# Patient Record
Sex: Male | Born: 1995 | Race: Black or African American | Hispanic: No | Marital: Single | Smoking: Never smoker
Health system: Southern US, Community
[De-identification: ages and names within clinical notes are randomized; demographics above are authoritative.]

## PROBLEM LIST (undated history)

## (undated) DIAGNOSIS — J45909 Unspecified asthma, uncomplicated: Secondary | ICD-10-CM

---

## 2018-07-07 ENCOUNTER — Emergency Department (HOSPITAL_COMMUNITY): Payer: Self-pay

## 2018-07-07 ENCOUNTER — Emergency Department (HOSPITAL_COMMUNITY)
Admission: EM | Admit: 2018-07-07 | Discharge: 2018-07-08 | Disposition: A | Discharge status: Home / Self Care | Payer: Self-pay | Attending: Emergency Medicine | Admitting: Emergency Medicine

## 2018-07-07 DIAGNOSIS — J069 Acute upper respiratory infection, unspecified: Secondary | ICD-10-CM | POA: Insufficient documentation

## 2018-07-07 DIAGNOSIS — B9789 Other viral agents as the cause of diseases classified elsewhere: Secondary | ICD-10-CM

## 2018-07-07 NOTE — ED Triage Notes (Signed)
Pt presents to ED for evaluation of cough, congestion, body aches, dizziness, weakness, and nausea x 2 weeks. Endorses productive cough, which is now clear.

## 2018-07-08 MED ORDER — ONDANSETRON 4 MG PO TBDP: 4.0000 mg | ORAL_TABLET | Freq: Three times a day (TID) | ORAL | 0 refills | Status: DC | PRN

## 2018-07-08 MED ORDER — IBUPROFEN 800 MG PO TABS: 800.0000 mg | ORAL_TABLET | Freq: Three times a day (TID) | ORAL | 0 refills | Status: DC

## 2018-07-08 MED ORDER — BENZONATATE 100 MG PO CAPS: 100.0000 mg | ORAL_CAPSULE | Freq: Three times a day (TID) | ORAL | 0 refills | Status: DC

## 2018-07-08 NOTE — Discharge Instructions (Addendum)
Take the prescribed medication as directed.  Continue drinking fluids, rest when you can. Follow-up with patient care center-- can call for appt. Return to the ED for new or worsening symptoms.

## 2018-07-08 NOTE — ED Provider Notes (Signed)
[facility] EMERGENCY DEPARTMENT Provider Note   CSN: [id number] Arrival date & time: 07/07/18  1702     History   Chief Complaint Chief Complaint  Patient presents with  . Influenza    HPI Alexander Washington is a 23 y.o. male.  The history is provided by the patient and medical records.  Influenza  Presenting symptoms: cough, myalgias, nausea and rhinorrhea   Associated symptoms: nasal congestion      23 year old male presenting to the ED with URI type symptoms for the past 3 weeks.  Reports initially started with nasal congestion and sore throat but then developed cough, body aches, chills, and nausea.  Symptoms were present for a few days and initially seemed to get better but have since recurred.  He does report numerous sick contacts with similar symptoms.  He has been able to eat and drink regularly without any vomiting or diarrhea.  He denies any chest pain or shortness of breath.  No abdominal pain.  Cough was initially productive with discolored sputum but now clear.  He has been taking NyQuil the past few days without relief.  No past medical history on file.  There are no active problems to display for this patient.      Home Medications    Prior to Admission medications   Not on File    Family History No family history on file.  Social History Social History   Tobacco Use  . Smoking status: Not on file  Substance Use Topics  . Alcohol use: Not on file  . Drug use: Not on file     Allergies   Patient has no known allergies.   Review of Systems Review of Systems  HENT: Positive for congestion and rhinorrhea.   Respiratory: Positive for cough.   Gastrointestinal: Positive for nausea.  Musculoskeletal: Positive for myalgias.  All other systems reviewed and are negative.    Physical Exam Updated Vital Signs BP 107/69 (BP Location: Right Arm)   Pulse 92   Temp 99.4 F (37.4 C) (Oral)   Resp 16   SpO2 97%   Physical  Exam Vitals signs and nursing note reviewed.  Constitutional:      Appearance: He is well-developed.  HENT:     Head: Normocephalic and atraumatic.     Right Ear: Tympanic membrane and ear canal normal.     Left Ear: Tympanic membrane and ear canal normal.     Nose: Congestion present.     Mouth/Throat:     Lips: Pink.     Mouth: Mucous membranes are moist.     Pharynx: Oropharynx is clear.     Comments: Some PND noted; Tonsils overall normal in appearance bilaterally without exudate; uvula midline without evidence of peritonsillar abscess; handling secretions appropriately; no difficulty swallowing or speaking; normal phonation without stridor Eyes:     Conjunctiva/sclera: Conjunctivae normal.     Pupils: Pupils are equal, round, and reactive to light.  Neck:     Musculoskeletal: Normal range of motion.  Cardiovascular:     Rate and Rhythm: Normal rate and regular rhythm.     Heart sounds: Normal heart sounds.  Pulmonary:     Effort: Pulmonary effort is normal.     Breath sounds: Normal breath sounds. No decreased breath sounds, wheezing or rhonchi.  Abdominal:     General: Bowel sounds are normal.     Palpations: Abdomen is soft.  Musculoskeletal: Normal range of motion.  Skin:    General:  Skin is warm and dry.  Neurological:     Mental Status: He is alert and oriented to person, place, and time.      ED Treatments / Results  Labs (all labs ordered are listed, but only abnormal results are displayed) Labs Reviewed - No data to display  EKG None  Radiology Dg Chest 2 View  Result Date: 07/07/2018 CLINICAL DATA:  Cough EXAM: CHEST - 2 VIEW COMPARISON:  None. FINDINGS: The heart size and mediastinal contours are within normal limits. Both lungs are clear. The visualized skeletal structures are unremarkable. IMPRESSION: No active cardiopulmonary disease. Electronically Signed   By: Jasmine Pang M.D.   On: 07/07/2018 21:13    Procedures Procedures (including critical  care time)  Medications Ordered in ED Medications - No data to display   Initial Impression / Assessment and Plan / ED Course  I have reviewed the triage vital signs and the nursing notes.  Pertinent labs & imaging results that were available during my care of the patient were reviewed by me and considered in my medical decision making (see chart for details).  23 year old male here with URI type symptoms over the past 2 weeks.  He is afebrile and nontoxic in appearance here.  Vitals are stable.  Does have some nasal congestion and postnasal drip but exam is otherwise benign.  Lungs are clear without any wheezes or rhonchi.  Chest x-ray was obtained from triage which is negative.  Feel this is likely viral process.  Discussed continued symptomatic care, rest, oral hydration.  Does not currently have PCP, can follow-up with the patient care center as needed.  He can return here for any new or worsening symptoms.  Final Clinical Impressions(s) / ED Diagnoses   Final diagnoses:  Viral URI with cough    ED Discharge Orders         Ordered    benzonatate (TESSALON) 100 MG capsule  Every 8 hours     07/08/18 0003    ondansetron (ZOFRAN ODT) 4 MG disintegrating tablet  Every 8 hours PRN     07/08/18 0003    ibuprofen (ADVIL,MOTRIN) 800 MG tablet  3 times daily     07/08/18 0003           Garlon Hatchet, PA-C 07/08/18 0007    Palumbo, April, MD 07/08/18 [phone]

## 2019-06-15 DIAGNOSIS — Z91013 Allergy to seafood: Secondary | ICD-10-CM | POA: Diagnosis not present

## 2019-06-15 DIAGNOSIS — K642 Third degree hemorrhoids: Secondary | ICD-10-CM | POA: Diagnosis not present

## 2019-06-15 DIAGNOSIS — K600 Acute anal fissure: Secondary | ICD-10-CM | POA: Diagnosis not present

## 2019-06-15 DIAGNOSIS — R194 Change in bowel habit: Secondary | ICD-10-CM | POA: Diagnosis not present

## 2019-06-15 DIAGNOSIS — K625 Hemorrhage of anus and rectum: Secondary | ICD-10-CM | POA: Diagnosis not present

## 2019-06-15 DIAGNOSIS — Z7689 Persons encountering health services in other specified circumstances: Secondary | ICD-10-CM | POA: Diagnosis not present

## 2019-06-15 DIAGNOSIS — K623 Rectal prolapse: Secondary | ICD-10-CM | POA: Diagnosis not present

## 2019-06-15 DIAGNOSIS — H6120 Impacted cerumen, unspecified ear: Secondary | ICD-10-CM | POA: Diagnosis not present

## 2019-07-06 DIAGNOSIS — K625 Hemorrhage of anus and rectum: Secondary | ICD-10-CM | POA: Diagnosis not present

## 2019-07-06 DIAGNOSIS — K5904 Chronic idiopathic constipation: Secondary | ICD-10-CM | POA: Diagnosis not present

## 2019-07-06 DIAGNOSIS — K602 Anal fissure, unspecified: Secondary | ICD-10-CM | POA: Diagnosis not present

## 2019-07-19 ENCOUNTER — Other Ambulatory Visit: Payer: Self-pay

## 2019-07-19 ENCOUNTER — Emergency Department (HOSPITAL_COMMUNITY)
Admission: EM | Admit: 2019-07-19 | Discharge: 2019-07-19 | Disposition: A | Discharge status: Home / Self Care | Payer: BC Managed Care – PPO | Attending: Emergency Medicine | Admitting: Emergency Medicine

## 2019-07-19 ENCOUNTER — Encounter (HOSPITAL_COMMUNITY): Payer: Self-pay | Admitting: Emergency Medicine

## 2019-07-19 DIAGNOSIS — J45909 Unspecified asthma, uncomplicated: Secondary | ICD-10-CM | POA: Diagnosis not present

## 2019-07-19 DIAGNOSIS — J029 Acute pharyngitis, unspecified: Secondary | ICD-10-CM

## 2019-07-19 DIAGNOSIS — Z20822 Contact with and (suspected) exposure to covid-19: Secondary | ICD-10-CM | POA: Diagnosis not present

## 2019-07-19 HISTORY — DX: Unspecified asthma, uncomplicated: J45.909

## 2019-07-19 LAB — GROUP A STREP BY PCR: Group A Strep by PCR: NOT DETECTED

## 2019-07-19 MED ORDER — DEXAMETHASONE SODIUM PHOSPHATE 10 MG/ML IJ SOLN
10.0000 mg | Freq: Once | INTRAMUSCULAR | Status: AC
  Administered 2019-07-19: 12:00:00 10 mg via INTRAMUSCULAR
  Filled 2019-07-19: qty 1

## 2019-07-19 MED ORDER — IBUPROFEN 400 MG PO TABS
600.0000 mg | ORAL_TABLET | Freq: Once | ORAL | Status: AC
  Administered 2019-07-19: 600 mg via ORAL
  Filled 2019-07-19: qty 1

## 2019-07-19 MED ORDER — LIDOCAINE VISCOUS HCL 2 % MT SOLN
15.0000 mL | Freq: Once | OROMUCOSAL | Status: AC
  Administered 2019-07-19: 12:00:00 15 mL via OROMUCOSAL
  Filled 2019-07-19: qty 15

## 2019-07-19 NOTE — ED Triage Notes (Signed)
Pt reports lymph node pain and swelling starting around 6 today. States the pain is on left side and goes to left ear. Denies any sick contacts. Denies and SOB

## 2019-07-19 NOTE — ED Provider Notes (Signed)
[facility] EMERGENCY DEPARTMENT Provider Note   CSN: [id number] Arrival date & time: 07/19/19  3220     History Chief Complaint  Patient presents with  . Sore Throat    Alexander Washington is a 24 y.o. male.  Alexander Washington is a 24 y.o. male with history of asthma, presents to the ED for evaluation of 3 days of sore throat.  When symptoms first began they are mild but sore throat has become a constant ache and he is also noted some swollen lymph nodes in his neck.  He reports it is painful to swallow but he has been able to eat and drink.  He had some rhinorrhea but denies any associated cough, no fevers or chills, no headaches, body aches, nausea or vomiting.  He has no known sick contacts.  He has not taken anything to treat his symptoms prior to arrival.  Reports pain in his throat was worse on the left and he has noticed some redness in his throat when he looks in the mirror.  No other aggravating or alleviating factors.        Past Medical History:  Diagnosis Date  . Asthma     There are no problems to display for this patient.   History reviewed. No pertinent surgical history.     No family history on file.  Social History   Tobacco Use  . Smoking status: Not on file  Substance Use Topics  . Alcohol use: Not on file  . Drug use: Not on file    Home Medications Prior to Admission medications   Medication Sig Start Date End Date Taking? Authorizing Provider  benzonatate (TESSALON) 100 MG capsule Take 1 capsule (100 mg total) by mouth every 8 (eight) hours. 07/08/18   Larene Pickett, PA-C  ibuprofen (ADVIL,MOTRIN) 800 MG tablet Take 1 tablet (800 mg total) by mouth 3 (three) times daily. 07/08/18   Larene Pickett, PA-C  ondansetron (ZOFRAN ODT) 4 MG disintegrating tablet Take 1 tablet (4 mg total) by mouth every 8 (eight) hours as needed for nausea. 07/08/18   Larene Pickett, PA-C    Allergies    Patient has no known allergies.  Review  of Systems   Review of Systems  Constitutional: Negative for chills and fever.  HENT: Positive for congestion, rhinorrhea and sore throat. Negative for ear pain.   Respiratory: Negative for cough and shortness of breath.   Cardiovascular: Negative for chest pain.  Gastrointestinal: Negative for abdominal pain, diarrhea, nausea and vomiting.  Musculoskeletal: Negative for arthralgias and myalgias.  Skin: Negative for color change and rash.  Neurological: Negative for headaches.    Physical Exam Updated Vital Signs BP (!) 113/59 (BP Location: Left Arm)   Pulse 97   Temp 99.2 F (37.3 C) (Oral)   Resp 16   Ht 5\' 11"  (1.803 m)   Wt 75.8 kg   SpO2 100%   BMI 23.29 kg/m   Physical Exam Vitals and nursing note reviewed.  Constitutional:      General: He is not in acute distress.    Appearance: He is well-developed. He is not ill-appearing or diaphoretic.  HENT:     Head: Normocephalic and atraumatic.     Mouth/Throat:     Mouth: Mucous membranes are moist.     Pharynx: Uvula midline. Pharyngeal swelling and posterior oropharyngeal erythema present.     Tonsils: No tonsillar exudate or tonsillar abscesses. 2+ on the right. 2+ on the left.  Comments: Mucous membranes are moist, the posterior oropharynx is erythematous and swollen, he has bilateral 2+ tonsillar edema, some small white exudates noted over the upper tonsils.  He is able to tolerate secretions, normal phonation. Eyes:     General:        Right eye: No discharge.        Left eye: No discharge.  Pulmonary:     Effort: Pulmonary effort is normal. No respiratory distress.     Breath sounds: Normal breath sounds.     Comments: Respirations equal and unlabored, patient able to speak in full sentences, lungs clear to auscultation bilaterally Musculoskeletal:     Cervical back: Neck supple.  Lymphadenopathy:     Cervical: Cervical adenopathy present.  Skin:    General: Skin is warm and dry.  Neurological:     Mental  Status: He is alert.     Coordination: Coordination normal.  Psychiatric:        Behavior: Behavior normal.     ED Results / Procedures / Treatments   Labs (all labs ordered are listed, but only abnormal results are displayed) Labs Reviewed  GROUP A STREP BY PCR  NOVEL CORONAVIRUS, NAA (HOSP ORDER, SEND-OUT TO REF LAB; TAT 18-24 HRS)    EKG None  Radiology No results found.  Procedures Procedures (including critical care time)  Medications Ordered in ED Medications  ibuprofen (ADVIL) tablet 600 mg (600 mg Oral Given 07/19/19 1130)  lidocaine (XYLOCAINE) 2 % viscous mouth solution 15 mL (15 mLs Mouth/Throat Given 07/19/19 1130)  dexamethasone (DECADRON) injection 10 mg (10 mg Intramuscular Given 07/19/19 1130)    ED Course  I have reviewed the triage vital signs and the nursing notes.  Pertinent labs & imaging results that were available during my care of the patient were reviewed by me and considered in my medical decision making (see chart for details).    MDM Rules/Calculators/A&P                     24 year old male presents with 3 days of sore throat it is erythematous with only one small exudate noted, uvula is midline and there is no signs on exam of peritonsillar abscess.  Patient has normal phonation and is tolerating secretions without difficulty.  His strep test is negative, I did send a Covid test and I suspect this pharyngitis is viral.  Patient was treated with ibuprofen, viscous lidocaine and IM Decadron with improvement in his symptoms, he is tolerating p.o. fluids.  He is stable for discharge home, I have counseled him on home quarantine until his Covid results return.  Patient expresses understanding and agreement with plan.  Discharged home in good condition.  Alexander Washington was evaluated in Emergency Department on 07/19/2019 for the symptoms described in the history of present illness. He was evaluated in the context of the global COVID-19 pandemic, which  necessitated consideration that the patient might be at risk for infection with the SARS-CoV-2 virus that causes COVID-19. Institutional protocols and algorithms that pertain to the evaluation of patients at risk for COVID-19 are in a state of rapid change based on information released by regulatory bodies including the CDC and federal and state organizations. These policies and algorithms were followed during the patient's care in the ED.  Final Clinical Impression(s) / ED Diagnoses Final diagnoses:  Pharyngitis, unspecified etiology    Rx / DC Orders ED Discharge Orders    None       Jodi Geralds  N, PA-C 07/19/19 1409    Benjiman Core, MD 07/19/19 1517

## 2019-07-19 NOTE — Discharge Instructions (Signed)
Your evaluation today is reassuring, your strep test is negative and I suspect the symptoms are due to a virus, you do have a Covid test pending.  You should continue to treat your symptoms with 600 mg of ibuprofen every 6 hours, and the 1000 mg of Tylenol every 6 hours, you can also use over-the-counter Cepacol throat lozenges.  Make sure you are drinking plenty of water.  If you start to develop worsening sore throat, high fevers, difficulty swallowing, talking or breathing, please return to the ED for reevaluation.  You have a Covid test pending and you should quarantine at home until you receive your results, these should be back in about 2 to 3 days, you will receive a phone call if results are positive, you can view negative results online through MyChart.  Please follow the instructions on your paperwork today to set up your MyChart account.

## 2019-07-20 LAB — NOVEL CORONAVIRUS, NAA (HOSP ORDER, SEND-OUT TO REF LAB; TAT 18-24 HRS): SARS-CoV-2, NAA: NOT DETECTED

## 2019-07-21 ENCOUNTER — Other Ambulatory Visit: Payer: Self-pay

## 2019-07-21 ENCOUNTER — Emergency Department (HOSPITAL_COMMUNITY): Payer: BC Managed Care – PPO

## 2019-07-21 ENCOUNTER — Encounter (HOSPITAL_COMMUNITY): Payer: Self-pay | Admitting: Emergency Medicine

## 2019-07-21 ENCOUNTER — Emergency Department (HOSPITAL_COMMUNITY)
Admission: EM | Admit: 2019-07-21 | Discharge: 2019-07-21 | Disposition: A | Discharge status: Home / Self Care | Payer: BC Managed Care – PPO | Attending: Emergency Medicine | Admitting: Emergency Medicine

## 2019-07-21 DIAGNOSIS — J029 Acute pharyngitis, unspecified: Secondary | ICD-10-CM | POA: Diagnosis not present

## 2019-07-21 DIAGNOSIS — J45909 Unspecified asthma, uncomplicated: Secondary | ICD-10-CM | POA: Insufficient documentation

## 2019-07-21 DIAGNOSIS — Z20822 Contact with and (suspected) exposure to covid-19: Secondary | ICD-10-CM | POA: Diagnosis not present

## 2019-07-21 DIAGNOSIS — J36 Peritonsillar abscess: Secondary | ICD-10-CM

## 2019-07-21 DIAGNOSIS — H6122 Impacted cerumen, left ear: Secondary | ICD-10-CM | POA: Diagnosis not present

## 2019-07-21 LAB — BASIC METABOLIC PANEL
Anion gap: 9 (ref 5–15)
BUN: 11 mg/dL (ref 6–20)
CO2: 27 mmol/L (ref 22–32)
Calcium: 9.3 mg/dL (ref 8.9–10.3)
Chloride: 103 mmol/L (ref 98–111)
Creatinine, Ser: 1.13 mg/dL (ref 0.61–1.24)
GFR calc Af Amer: >60 mL/min (ref >60)
GFR calc non Af Amer: >60 mL/min (ref >60)
Glucose, Bld: 98 mg/dL (ref 70–99)
Potassium: 4 mmol/L (ref 3.5–5.1)
Sodium: 139 mmol/L (ref 135–145)

## 2019-07-21 LAB — RESPIRATORY PANEL BY RT PCR (FLU A&B, COVID)
Influenza A by PCR: NEGATIVE
Influenza B by PCR: NEGATIVE
SARS Coronavirus 2 by RT PCR: NEGATIVE

## 2019-07-21 LAB — GROUP A STREP BY PCR: Group A Strep by PCR: NOT DETECTED

## 2019-07-21 LAB — CBC WITH DIFFERENTIAL/PLATELET
Abs Immature Granulocytes: 0.02 10*3/uL (ref 0.00–0.07)
Basophils Absolute: 0 10*3/uL (ref 0.0–0.1)
Basophils Relative: 0 %
Eosinophils Absolute: 0 10*3/uL (ref 0.0–0.5)
Eosinophils Relative: 0 %
HCT: 47 % (ref 39.0–52.0)
Hemoglobin: 15.2 g/dL (ref 13.0–17.0)
Immature Granulocytes: 0 %
Lymphocytes Relative: 15 %
Lymphs Abs: 1.8 10*3/uL (ref 0.7–4.0)
MCH: 28.9 pg (ref 26.0–34.0)
MCHC: 32.3 g/dL (ref 30.0–36.0)
MCV: 89.4 fL (ref 80.0–100.0)
Monocytes Absolute: 1.1 10*3/uL — ABNORMAL HIGH (ref 0.1–1.0)
Monocytes Relative: 10 %
Neutro Abs: 8.9 10*3/uL — ABNORMAL HIGH (ref 1.7–7.7)
Neutrophils Relative %: 75 %
Platelets: 243 10*3/uL (ref 150–400)
RBC: 5.26 MIL/uL (ref 4.22–5.81)
RDW: 12.9 % (ref 11.5–15.5)
WBC: 11.9 10*3/uL — ABNORMAL HIGH (ref 4.0–10.5)
nRBC: 0 % (ref 0.0–0.2)

## 2019-07-21 MED ORDER — DEXAMETHASONE SODIUM PHOSPHATE 10 MG/ML IJ SOLN
10.0000 mg | Freq: Once | INTRAMUSCULAR | Status: AC
  Administered 2019-07-21: 05:00:00 10 mg via INTRAVENOUS
  Filled 2019-07-21: qty 1

## 2019-07-21 MED ORDER — IOHEXOL 300 MG/ML  SOLN
75.0000 mL | Freq: Once | INTRAMUSCULAR | Status: AC | PRN
  Administered 2019-07-21: 07:00:00 75 mL via INTRAVENOUS

## 2019-07-21 MED ORDER — SODIUM CHLORIDE 0.9 % IV BOLUS (SEPSIS)
1000.0000 mL | Freq: Once | INTRAVENOUS | Status: AC
  Administered 2019-07-21: 1000 mL via INTRAVENOUS

## 2019-07-21 MED ORDER — METHYLPREDNISOLONE 4 MG PO TBPK: ORAL_TABLET | ORAL | 0 refills | Status: AC

## 2019-07-21 MED ORDER — KETOROLAC TROMETHAMINE 30 MG/ML IJ SOLN
30.0000 mg | Freq: Once | INTRAMUSCULAR | Status: AC
  Administered 2019-07-21: 05:00:00 30 mg via INTRAVENOUS
  Filled 2019-07-21: qty 1

## 2019-07-21 MED ORDER — FENTANYL CITRATE (PF) 100 MCG/2ML IJ SOLN
50.0000 ug | Freq: Once | INTRAMUSCULAR | Status: AC
  Administered 2019-07-21: 50 ug via INTRAVENOUS
  Filled 2019-07-21: qty 2

## 2019-07-21 MED ORDER — SODIUM CHLORIDE 0.9 % IV SOLN: INTRAVENOUS | Status: DC

## 2019-07-21 MED ORDER — CLINDAMYCIN PHOSPHATE 600 MG/50ML IV SOLN
600.0000 mg | Freq: Once | INTRAVENOUS | Status: AC
  Administered 2019-07-21: 600 mg via INTRAVENOUS
  Filled 2019-07-21: qty 50

## 2019-07-21 MED ORDER — CLINDAMYCIN HCL 300 MG PO CAPS: 300.0000 mg | ORAL_CAPSULE | Freq: Three times a day (TID) | ORAL | 0 refills | Status: DC

## 2019-07-21 MED ORDER — ONDANSETRON HCL 4 MG/2ML IJ SOLN
4.0000 mg | Freq: Once | INTRAMUSCULAR | Status: AC
  Administered 2019-07-21: 07:00:00 4 mg via INTRAVENOUS
  Filled 2019-07-21: qty 2

## 2019-07-21 NOTE — ED Provider Notes (Signed)
TIME SEEN: 4:52 AM  CHIEF COMPLAINT: Sore throat, worse on the left side  HPI: Patient is a 24 year old male with history of asthma who presents to the emergency department with sore throat, worse on the left side for the past 5 days.  No known fevers.  Has a hard time opening his mouth and swallowing secondary to pain.  No previous history of peritonsillar abscess.  ROS: See HPI Constitutional: no fever  Eyes: no drainage  ENT: no runny nose   Cardiovascular:  no chest pain  Resp: no SOB  GI: no vomiting GU: no dysuria Integumentary: no rash  Allergy: no hives  Musculoskeletal: no leg swelling  Neurological: no slurred speech ROS otherwise negative  PAST MEDICAL HISTORY/PAST SURGICAL HISTORY:  Past Medical History:  Diagnosis Date  . Asthma     MEDICATIONS:  Prior to Admission medications   Medication Sig Start Date End Date Taking? Authorizing Provider  benzonatate (TESSALON) 100 MG capsule Take 1 capsule (100 mg total) by mouth every 8 (eight) hours. 07/08/18   Garlon Hatchet, PA-C  ibuprofen (ADVIL,MOTRIN) 800 MG tablet Take 1 tablet (800 mg total) by mouth 3 (three) times daily. 07/08/18   Garlon Hatchet, PA-C  ondansetron (ZOFRAN ODT) 4 MG disintegrating tablet Take 1 tablet (4 mg total) by mouth every 8 (eight) hours as needed for nausea. 07/08/18   Garlon Hatchet, PA-C    ALLERGIES:  No Known Allergies  SOCIAL HISTORY:  Social History   Tobacco Use  . Smoking status: Never Smoker  . Smokeless tobacco: Never Used  Substance Use Topics  . Alcohol use: Never    FAMILY HISTORY: No family history on file.  EXAM: BP 135/84 (BP Location: Left Arm)   Pulse (!) 103   Temp 98.3 F (36.8 C) (Oral)   Resp 16   SpO2 100%  CONSTITUTIONAL: Alert and oriented and responds appropriately to questions.  Appears uncomfortable, tearful HEAD: Normocephalic EYES: Conjunctivae clear, pupils appear equal, EOM appear intact ENT: normal nose; moist mucous membranes, patient  has bilateral tonsillar hypertrophy with small exudate and posterior pharyngeal erythema without petechiae.  He does seem to have more fullness on the left side than the right exam very limited secondary to trismus.  He does have a slightly muffled voice.  No stridor, drooling.  He does have trismus.  No sign of dental abscess or dental caries on exam but limited due to patient's pain.  His tongue sits flat in the bottom of his mouth.   NECK: Supple, normal ROM; patient does have some mild cervical lymphadenopathy and is tender over the left side of the neck, trachea is midline; no soft tissue swelling, warmth or erythema patient does have some mild cervical lymphadenopathy and is tender over the left side of the neck, trachea is midline, no soft tissue swelling, warmth or erythema CARD: Regular and tachycardic; S1 and S2 appreciated; no murmurs, no clicks, no rubs, no gallops RESP: Normal chest excursion without splinting or tachypnea; breath sounds clear and equal bilaterally; no wheezes, no rhonchi, no rales, no hypoxia or respiratory distress, speaking full sentences ABD/GI: Normal bowel sounds; non-distended; soft, non-tender, no rebound, no guarding, no peritoneal signs, no hepatosplenomegaly BACK:  The back appears normal EXT: Normal ROM in all joints; no deformity noted, no edema; no cyanosis SKIN: Normal color for age and race; warm; no rash on exposed skin NEURO: Moves all extremities equally PSYCH: The patient's mood and manner are appropriate.   MEDICAL DECISION MAKING:  Patient here with what I suspect is a left peritonsillar abscess.  We will proceed with labs, CT.  Will treat with Toradol, IV fluids, Decadron and clindamycin.  We will keep him n.p.o. at this time.  Anticipate discussion with ENT once results back.  Strep test is negative.  Will obtain rapid Covid swab in case patient needs urgent intervention.  No current airway compromise.  Sats 100% on room air at rest currently.   ED  PROGRESS: Patient's labs show a leukocytosis with left shift.  Continues to be tachycardic.  Will provide with more pain medication.  Covid swab and CT of the soft tissues of the neck pending.     7:18 AM  Spoke with Dr. Blenda Nicely on-call for ENT.  Appreciate her help.  She will see patient in the emergency department given he is still having pretty significant discomfort and continues to be tachycardic up into the 110s.  He reports he is feeling better after Toradol and Decadron but still unable to open his mouth fully.  Covid swab is pending.  CT read is pending but I have personally reviewed the results and there does appear to be a left peritonsillar abscess that on my measurement is at least 1.75 cm x 1.5 cm.  Patient updated with plan that she will be seen in the emergency department by ENT on call.  Dr. Alvino Chapel, EDP, also aware of patient.   I reviewed all nursing notes and pertinent previous records as available.  I have interpreted any EKGs, lab and urine results, imaging (as available).   CRITICAL CARE Performed by: Pryor Curia   Total critical care time: 45 minutes  Critical care time was exclusive of separately billable procedures and treating other patients.  Critical care was necessary to treat or prevent imminent or life-threatening deterioration.  Critical care was time spent personally by me on the following activities: development of treatment plan with patient and/or surrogate as well as nursing, discussions with consultants, evaluation of patient's response to treatment, examination of patient, obtaining history from patient or surrogate, ordering and performing treatments and interventions, ordering and review of laboratory studies, ordering and review of radiographic studies, pulse oximetry and re-evaluation of patient's condition.   Alexander Washington was evaluated in Emergency Department on 07/21/2019 for the symptoms described in the history of present illness. He  was evaluated in the context of the global COVID-19 pandemic, which necessitated consideration that the patient might be at risk for infection with the SARS-CoV-2 virus that causes COVID-19. Institutional protocols and algorithms that pertain to the evaluation of patients at risk for COVID-19 are in a state of rapid change based on information released by regulatory bodies including the CDC and federal and state organizations. These policies and algorithms were followed during the patient's care in the ED.  Patient was seen wearing N95, face shield, gloves.    Dudley Cooley, Delice Bison, DO 07/21/19 [phone]

## 2019-07-21 NOTE — ED Provider Notes (Signed)
  Physical Exam  BP 107/62   Pulse 100   Temp 98.3 F (36.8 C) (Oral)   Resp 16   SpO2 97%   Physical Exam  ED Course/Procedures     Procedures  MDM  Patient peritonsillar abscess.  Drained in the ER by Dr. Doran Heater.  Did have swelling of his uvula.  Patient feels much better now.  Has tolerated orals.  Will discharge home with clindamycin and Medrol Dosepak.       Benjiman Core, MD 07/21/19 1046

## 2019-07-21 NOTE — ED Triage Notes (Signed)
Patient reports persistent sore throat with swelling/dysphagia onset last week , denies fever or chills , airway intact / respirations unlabored.

## 2019-07-21 NOTE — Discharge Instructions (Addendum)
RINSE INSTRUCTIONS  Irrigate your mouth with the following mixture 3 times daily for the next 5 days:  1/2 cup of warm water  1 teaspoon of salt  1/2 teaspoon of honey  Eat a soft diet for the next few days. No hard/crunchy foods or foods with sharp edges.  Your pain should continue to improve over the next couple of days.  Take tylenol and/or ibuprofen as needed for pain. Follow the package directions.  Take a probiotic, such as yogurt with active cultures, while taking your antibiotic. Take your antibiotic until the treatment is complete. Do not stop early.   Return to the ER if you experience any difficulty breathing or a significant bleeding event. Call the clinic if you have other concerns.      Peritonsillar Abscess A peritonsillar abscess is an infected area in your throat that is filled with pus. It forms behind your tonsils. This may be treated by:  Draining the pus. Your doctor may do this with a syringe and a needle (needle aspiration) or by making a cut in the abscess.  Using antibiotic medicine. Follow these instructions at home: Medicines  Take over-the-counter and prescription medicines only as told by your doctor.  If you were prescribed an antibiotic, take it as told by your doctor. Do not stop taking the antibiotic even if you start to feel better. Eating and drinking   Drink enough fluid to keep your pee (urine) pale yellow.  While your throat is sore, try one of these: ? Only drinking liquids. ? Eating only soft foods, such as yogurt and ice cream. General instructions  Rest as much as you can. Get plenty of sleep.  Return to your normal activities as told by your doctor. Ask your doctor what activities are safe for you.  If your abscess was drained, gargle with a salt-water mixture 3-4 times a day or as needed. ? To make a salt-water mixture, completely dissolve -1 tsp of salt in 1 cup of warm water. ? Do not swallow this mixture.  Do not use  any products that have nicotine or tobacco in them. These include cigarettes and e-cigarettes. If you need help quitting, ask your doctor.  Keep all follow-up visits as told by your doctor. This is important. Contact a doctor if you have:  More pain, swelling, redness, or pus in your throat.  A headache.  Low energy (lethargy).  A general feeling of illness (malaise).  A fever.  Dizziness.  Trouble swallowing.  Trouble eating.  Signs of body fluid loss (dehydration), such as: ? Feeling light-headed when you are standing. ? Peeing (urinating) less than usual. ? A fast heart rate. ? Dry mouth. Get help right away if you:  Have trouble talking.  Have trouble breathing.  Breathing is easier when you lean forward.  Cough up blood.  Throw up (vomit) blood.  Have very bad throat pain and it does not get better with medicine. Summary  A peritonsillar abscess is an infected area in your throat that is filled with pus.  You may be treated by having the abscess drained and by taking antibiotic medicine.  Contact a doctor if you have trouble swallowing or eating.  Get help right away if you cough up blood or see blood when you throw up (vomit). This information is not intended to replace advice given to you by your health care provider. Make sure you discuss any questions you have with your health care provider. Document Revised: 05/09/2017 Document  Reviewed: 04/08/2017 Elsevier Patient Education  2020 [employer]  

## 2019-07-21 NOTE — Consult Note (Signed)
OTOLARYNGOLOGY CONSULTATION  Primary Care Physician: Merrilee Seashore, MD Patient Location at Initial Consult: Emergency Department Chief Complaint/Reason for Consult: Left peritonsillar abscess  History of Presenting Illness:    Alexander Washington is a  24 y.o. male presenting with  Left peritonsillar abscess.  He began about 5 days ago with severe left-sided throat pain.  He has never had any issues with recurrent strep or peritonsillar abscesses.  He has had some pain radiating to his left ear.  It has become difficult to open his mouth and difficult to swallow.  He is not short of breath.  He has no ongoing dental issues.  No other medical problems.  Never had any prior surgeries.  No tobacco alcohol or drug usage.  Past Medical History:  Diagnosis Date  . Asthma     History reviewed. No pertinent surgical history.  No family history on file.  No one in the family has bleeding disorders or problems with anesthesia  Social History   Socioeconomic History  . Marital status: Single    Spouse name: Not on file  . Number of children: Not on file  . Years of education: Not on file  . Highest education level: Not on file  Occupational History  . Not on file  Tobacco Use  . Smoking status: Never Smoker  . Smokeless tobacco: Never Used  Substance and Sexual Activity  . Alcohol use: Never  . Drug use: Never  . Sexual activity: Not on file  Other Topics Concern  . Not on file  Social History Narrative  . Not on file   Social Determinants of Health   Financial Resource Strain:   . Difficulty of Paying Living Expenses: Not on file  Food Insecurity:   . Worried About Charity fundraiser in the Last Year: Not on file  . Ran Out of Food in the Last Year: Not on file  Transportation Needs:   . Lack of Transportation (Medical): Not on file  . Lack of Transportation (Non-Medical): Not on file  Physical Activity:   . Days of Exercise per Week: Not on file  . Minutes of  Exercise per Session: Not on file  Stress:   . Feeling of Stress : Not on file  Social Connections:   . Frequency of Communication with Friends and Family: Not on file  . Frequency of Social Gatherings with Friends and Family: Not on file  . Attends Religious Services: Not on file  . Active Member of Clubs or Organizations: Not on file  . Attends Archivist Meetings: Not on file  . Marital Status: Not on file    No current facility-administered medications on file prior to encounter.   Current Outpatient Medications on File Prior to Encounter  Medication Sig Dispense Refill  . benzonatate (TESSALON) 100 MG capsule Take 1 capsule (100 mg total) by mouth every 8 (eight) hours. 21 capsule 0  . ibuprofen (ADVIL,MOTRIN) 800 MG tablet Take 1 tablet (800 mg total) by mouth 3 (three) times daily. 21 tablet 0  . ondansetron (ZOFRAN ODT) 4 MG disintegrating tablet Take 1 tablet (4 mg total) by mouth every 8 (eight) hours as needed for nausea. 10 tablet 0    No Known Allergies   Review of Systems: ROS complete negative except the above mentioned in the HPI.   OBJECTIVE: Vital Signs: Vitals:   07/21/19 0630 07/21/19 0730  BP: 103/74 100/71  Pulse: (!) 108 (!) 106  Resp:  16  Temp:  SpO2: 98% 96%    I&O No intake or output data in the 24 hours ending 07/21/19 0800  Physical Exam General: Well developed, well nourished. No acute distress. Voice with mild hot potato quality  Head/Face: Normocephalic, atraumatic. No scars or lesions. No sinus tenderness. Facial nerve intact and equal bilaterally.  No facial lacerations. Salivary glands non tender and without palpable masses  Eyes: Globes well positioned, no proptosis Lids: No periorbital edema/ecchymosis. No lid laceration Conjunctiva: No chemosis, hemorrhage PERRL Extra occular movement: Full ROM bilaterally. No gaze restriction    Ears: No gross deformity. Normal external canal on the right. Tympanic membrane intact on  the right and without effusion The left external auditory canal is of normal diameter but there is a significant firm cerumen impaction.  Hearing:  Normal speech reception.  Nose: No gross deformity or lesions. No purulent discharge. Septum midline. No turbinate hypertrophy.  Mouth/Oropharynx: Lips without any lesions. Dentition normal.  There is significant soft palate edema with uvular hydrops.  The uvula is deviated towards the right.  There is obvious left peritonsillar cellulitis.  Palpable fluctuance in this region.  Neck: Trachea midline. No masses. No thyromegaly or nodules palpated. No crepitus.  Lymphatic: No lymphadenopathy in the neck.  Respiratory: No stridor or distress.  Cardiovascular: Regular rate and rhythm.  Extremities: No edema or cyanosis. Warm and well-perfused.  Skin: No scars or lesions on face or neck.  Neurologic: CN II-XII intact. Moving all extremities without gross abnormality.  Other:      Labs: Lab Results  Component Value Date   WBC 11.9 (H) 07/21/2019   HGB 15.2 07/21/2019   HCT 47.0 07/21/2019   PLT 243 07/21/2019   NA 139 07/21/2019   K 4.0 07/21/2019   CL 103 07/21/2019   CREATININE 1.13 07/21/2019   BUN 11 07/21/2019   CO2 27 07/21/2019     Review of Ancillary Data / Diagnostic Tests:  CT neck reviewed and demonstrates a significant large peritonsillar abscess on the left side.  No other masses or lymphadenopathy.  The airway remains patent.  There is some uvular edema.  The epiglottis, false folds and true cords are without any swelling.  Procedure: Left  peritonsillar abscess drainage. Findings: Peritonsillar abscess and purulence was encountered on the left side.  Details: Informed consent was obtained.  The patient's peritonsillar region was anesthetized with Cetacaine spray followed by 1% lidocaine with 1-100,000 epinephrine.  The peritonsillar region was incised with a 15 blade.  A curved hemostat was utilized to open up loculations.   Frank purulence was encountered.  This was evacuated.  All loculations were probed and opened with a blunt small curved hemostat.  Next, the peritonsillar space was irrigated copiously with normal saline.  The patient's mouth was suctioned.  Hemostasis was achieved spontaneously.  All instrumentation was removed.  The patient tolerated the procedure well.   ASSESSMENT:  24 y.o. male with left peritonsillar abscess now status post I&D.  At the conclusion of drainage, the patient had resolution of his trismus.  He has no shortness of breath.  He is somewhat irritated by a swollen uvula which is causing some gagging.  I anticipate this to rapidly improve on steroids antibiotics and now that the abscess is drained.  RECOMMENDATIONS: -Clindamycin 300 mg 3 times daily x10 days Medrol Dosepak. Please observe in the emergency department for a few hours to ensure that his uvular swelling continues to improve and he is little bit more comfortable. Encourage ice chips  and ice water.  Soft diet x 1 week.   Follow up with me in 1 week for wound recheck and left ear cerumen removal.    Misty Stanley, MD  [facility] Office phone [phone]

## 2019-09-27 ENCOUNTER — Encounter (HOSPITAL_COMMUNITY): Payer: Self-pay | Admitting: Emergency Medicine

## 2019-09-27 ENCOUNTER — Emergency Department (HOSPITAL_COMMUNITY)
Admission: EM | Admit: 2019-09-27 | Discharge: 2019-09-28 | Disposition: A | Discharge status: Home / Self Care | Payer: BC Managed Care – PPO | Attending: Emergency Medicine | Admitting: Emergency Medicine

## 2019-09-27 DIAGNOSIS — J45909 Unspecified asthma, uncomplicated: Secondary | ICD-10-CM | POA: Diagnosis not present

## 2019-09-27 DIAGNOSIS — J36 Peritonsillar abscess: Secondary | ICD-10-CM | POA: Insufficient documentation

## 2019-09-27 DIAGNOSIS — J029 Acute pharyngitis, unspecified: Secondary | ICD-10-CM | POA: Diagnosis present

## 2019-09-27 LAB — GROUP A STREP BY PCR: Group A Strep by PCR: NOT DETECTED

## 2019-09-27 MED ORDER — DEXAMETHASONE SODIUM PHOSPHATE 10 MG/ML IJ SOLN
5.0000 mg | Freq: Once | INTRAMUSCULAR | Status: AC
  Administered 2019-09-28: 5 mg via INTRAVENOUS
  Filled 2019-09-27: qty 1

## 2019-09-27 MED ORDER — CLINDAMYCIN PHOSPHATE 600 MG/50ML IV SOLN
600.0000 mg | Freq: Once | INTRAVENOUS | Status: AC
  Administered 2019-09-27: 23:00:00 600 mg via INTRAVENOUS
  Filled 2019-09-27: qty 50

## 2019-09-27 MED ORDER — KETOROLAC TROMETHAMINE 30 MG/ML IJ SOLN
15.0000 mg | Freq: Once | INTRAMUSCULAR | Status: AC
  Administered 2019-09-28: 15 mg via INTRAVENOUS
  Filled 2019-09-27: qty 1

## 2019-09-27 MED ORDER — DEXAMETHASONE SODIUM PHOSPHATE 10 MG/ML IJ SOLN
10.0000 mg | Freq: Once | INTRAMUSCULAR | Status: AC
  Administered 2019-09-27: 10 mg via INTRAVENOUS
  Filled 2019-09-27: qty 1

## 2019-09-27 MED ORDER — KETOROLAC TROMETHAMINE 30 MG/ML IJ SOLN
30.0000 mg | Freq: Once | INTRAMUSCULAR | Status: AC
  Administered 2019-09-27: 23:00:00 30 mg via INTRAVENOUS
  Filled 2019-09-27: qty 1

## 2019-09-27 MED ORDER — SODIUM CHLORIDE 0.9 % IV BOLUS
1000.0000 mL | Freq: Once | INTRAVENOUS | Status: AC
  Administered 2019-09-27: 23:00:00 1000 mL via INTRAVENOUS

## 2019-09-27 MED ORDER — ONDANSETRON HCL 4 MG/2ML IJ SOLN
4.0000 mg | Freq: Once | INTRAMUSCULAR | Status: AC
  Administered 2019-09-27: 23:00:00 4 mg via INTRAVENOUS
  Filled 2019-09-27: qty 2

## 2019-09-27 NOTE — ED Notes (Signed)
Pt provided with beverage and snacks for PO Challenge

## 2019-09-27 NOTE — ED Provider Notes (Signed)
Medical screening examination/treatment/procedure(s) were conducted as a shared visit with non-physician practitioner(s) and myself.  I personally evaluated the patient during the encounter.    23 year old male presents with concern for possible recurrent peritonsillar abscess.  Patient has no signs of airway compromise here.  Will treat for posterior pharyngeal infection and give return precautions   Lorre Nick, MD 09/27/19 2253

## 2019-09-27 NOTE — ED Triage Notes (Signed)
Pt arrives with reoccurrence of tonsillar abscess. Pt states he had this drained around february and about 3 days ago came back. No sob or trouble breathing, airway intact. Swelling noted to left side of throat.

## 2019-09-27 NOTE — ED Provider Notes (Signed)
[facility] EMERGENCY DEPARTMENT Provider Note   CSN: [id number] Arrival date & time: 09/27/19  1438     History Chief Complaint  Patient presents with  . Abscess    Alexander Washington is a 24 y.o. male with PMH/o Asthma, peritonsillar abscess who presents for evaluation of left sided sore throat that has been ongoing for 3 days. He states it feels similar to a previous peritonsillar abscess he had in February 2021. He is still able to tolerate his secretions but reports pain with doing so. He has not noted any fevers. He feels like the pain radiates to the left side of his face. He denies any SOB, vomiting, fever.   The history is provided by the patient.       Past Medical History:  Diagnosis Date  . Asthma     There are no problems to display for this patient.   History reviewed. No pertinent surgical history.     No family history on file.  Social History   Tobacco Use  . Smoking status: Never Smoker  . Smokeless tobacco: Never Used  Substance Use Topics  . Alcohol use: Never  . Drug use: Never    Home Medications Prior to Admission medications   Medication Sig Start Date End Date Taking? Authorizing Provider  clindamycin (CLEOCIN) 150 MG capsule Take 3 capsules (450 mg total) by mouth 3 (three) times daily for 10 days. 09/28/19 10/08/19  Maxwell Caul, PA-C  HYDROcodone-acetaminophen (NORCO/VICODIN) 5-325 MG tablet Take 1-2 tablets by mouth every 6 (six) hours as needed. 09/28/19   Maxwell Caul, PA-C  methylPREDNISolone (MEDROL DOSEPAK) 4 MG TBPK tablet Use per package directions. Patient not taking: Reported on 09/27/2019 07/21/19   Benjiman Core, MD  predniSONE (DELTASONE) 20 MG tablet Take 2 tablets (40 mg total) by mouth daily for 4 days. 09/28/19 10/02/19  Maxwell Caul, PA-C    Allergies    Fish allergy  Review of Systems   Review of Systems  Constitutional: Negative for fever.  HENT: Positive for sore throat.  Negative for trouble swallowing.   Respiratory: Negative for shortness of breath.   Gastrointestinal: Negative for vomiting.  All other systems reviewed and are negative.   Physical Exam Updated Vital Signs BP 104/64   Pulse 72   Temp 98.6 F (37 C) (Oral)   Resp 16   Ht 5\' 11"  (1.803 m)   Wt 77.1 kg   SpO2 100%   BMI 23.71 kg/m   Physical Exam Vitals and nursing note reviewed.  Constitutional:      Appearance: He is well-developed.  HENT:     Head: Normocephalic and atraumatic.     Comments: Face is symmetric in appearance without any overlying warmth, erythema or edema.     Mouth/Throat:     Pharynx: Pharyngeal swelling, oropharyngeal exudate and posterior oropharyngeal erythema present.     Tonsils: Tonsillar exudate present.     Comments: Posterior oropharynx is erythematous, edematous with bilateral edema and left side more swollen. Left tonsillar exudates noted. Uvula appears midline. Airway is patent, phonation is intact.  Eyes:     General: No scleral icterus.       Right eye: No discharge.        Left eye: No discharge.     Conjunctiva/sclera: Conjunctivae normal.     Comments: PERRL. EOMs intact. No nystagmus. No neglect.   Pulmonary:     Effort: Pulmonary effort is normal.  Skin:  General: Skin is warm and dry.  Neurological:     Mental Status: He is alert.  Psychiatric:        Speech: Speech normal.        Behavior: Behavior normal.     ED Results / Procedures / Treatments   Labs (all labs ordered are listed, but only abnormal results are displayed) Labs Reviewed  GROUP A STREP BY PCR    EKG None  Radiology No results found.  Procedures Procedures (including critical care time)  Medications Ordered in ED Medications  sodium chloride 0.9 % bolus 1,000 mL (0 mLs Intravenous Stopped 09/27/19 2350)  ondansetron (ZOFRAN) injection 4 mg (4 mg Intravenous Given 09/27/19 2248)  ketorolac (TORADOL) 30 MG/ML injection 30 mg (30 mg Intravenous  Given 09/27/19 2248)  clindamycin (CLEOCIN) IVPB 600 mg (0 mg Intravenous Stopped 09/27/19 2350)  dexamethasone (DECADRON) injection 10 mg (10 mg Intravenous Given 09/27/19 2248)  ketorolac (TORADOL) 30 MG/ML injection 15 mg (15 mg Intravenous Given 09/28/19 0016)  dexamethasone (DECADRON) injection 5 mg (5 mg Intravenous Given 09/28/19 0016)    ED Course  I have reviewed the triage vital signs and the nursing notes.  Pertinent labs & imaging results that were available during my care of the patient were reviewed by me and considered in my medical decision making (see chart for details).    MDM Rules/Calculators/A&P                      24 y.o. M who presents for evaluation of left sided sore throat x 3 days. History of peritonsillar abscess and states this feels the same. No fevers. Reports pain with swallowing but still able to tolerate PO and his secretions. No SOB, vomiting. Patient is afebrile, non-toxic appearing, sitting comfortably on examination table. Vital signs reviewed and stable.  On exam, he is tolerating his secretions with no drooling. No evidence of respiratory distress. He does have a posterior erythema, edema and exudates with some slight asymmetric swelling in the left side. Concern for early peritonsillar abscess. History/physical exam is not concerning for Ludwig's angina, retropharyngeal abscess. Plan for fluids, meds here in the ED.   Strep negative.  Patient after meds.  Patient does report feeling better.  Examination stable with no signs of distress.  No difficulty breathing.  Plan to p.o. challenge.  Patient was able to eat several packets of graham crackers and drink soda without any difficulty.  He reports that he was able to swallow without any difficulty.  He denies any trouble breathing, vomiting.  He is hemodynamically stable.  I discussed with him that his symptoms are most likely evidence of early peritonsillar abscess.  He does have some asymmetric swelling of  the left tonsil but the uvula is midline.  He does not have any muffled phonation.  He does report that this not feel as bad as his last peritonsillar abscess.  We will plan to send him home on clindamycin, steroids.  Patient instructed to follow-up with ear nose and throat.  At this time, he exhibits no evidence of respiratory distress.  Discussed patient with Dr. Zenia Resides who is agreeable to plan. Patient had ample opportunity for questions and discussion. All patient's questions were answered with full understanding. Strict return precautions discussed. Patient expresses understanding and agreement to plan.   Portions of this note were generated with Lobbyist. Dictation errors may occur despite best attempts at proofreading.  Final Clinical Impression(s) / ED Diagnoses Final  diagnoses:  Peritonsillar abscess    Rx / DC Orders ED Discharge Orders         Ordered    clindamycin (CLEOCIN) 150 MG capsule  3 times daily     09/28/19 0026    predniSONE (DELTASONE) 20 MG tablet  Daily     09/28/19 0026    HYDROcodone-acetaminophen (NORCO/VICODIN) 5-325 MG tablet  Every 6 hours PRN     09/28/19 0026           Maxwell Caul, PA-C 09/28/19 0159    Lorre Nick, MD 09/28/19 1246

## 2019-09-28 MED ORDER — HYDROCODONE-ACETAMINOPHEN 5-325 MG PO TABS: 1.0000 | ORAL_TABLET | Freq: Four times a day (QID) | ORAL | 0 refills | Status: AC | PRN

## 2019-09-28 MED ORDER — CLINDAMYCIN HCL 150 MG PO CAPS: 450.0000 mg | ORAL_CAPSULE | Freq: Three times a day (TID) | ORAL | 0 refills | Status: AC

## 2019-09-28 MED ORDER — PREDNISONE 20 MG PO TABS: 40.0000 mg | ORAL_TABLET | Freq: Every day | ORAL | 0 refills | Status: AC

## 2019-09-28 NOTE — Discharge Instructions (Addendum)
Take antibiotics as directed.  Take pain medication, prednisone as directed.  As we discussed, you will need to call ear nose and throat tomorrow to arrange for an outpatient appointment.  Return emergency department any difficulty breathing, difficulty swallowing your saliva, vomiting, worsening pain or any other worsening concerning symptoms.

## 2019-09-28 NOTE — ED Notes (Signed)
Patient verbalizes understanding of discharge instructions. Opportunity for questioning and answers were provided. Armband removed by staff, pt discharged from ED ambulatory to home.  

## 2019-09-29 DIAGNOSIS — J36 Peritonsillar abscess: Secondary | ICD-10-CM | POA: Diagnosis not present

## 2020-04-09 ENCOUNTER — Emergency Department (HOSPITAL_COMMUNITY)
Admission: EM | Admit: 2020-04-09 | Discharge: 2020-04-09 | Disposition: A | Discharge status: Home / Self Care | Payer: BC Managed Care – PPO | Attending: Emergency Medicine | Admitting: Emergency Medicine

## 2020-04-09 ENCOUNTER — Other Ambulatory Visit: Payer: Self-pay

## 2020-04-09 DIAGNOSIS — R591 Generalized enlarged lymph nodes: Secondary | ICD-10-CM | POA: Insufficient documentation

## 2020-04-09 DIAGNOSIS — J45909 Unspecified asthma, uncomplicated: Secondary | ICD-10-CM | POA: Insufficient documentation

## 2020-04-09 DIAGNOSIS — L539 Erythematous condition, unspecified: Secondary | ICD-10-CM | POA: Insufficient documentation

## 2020-04-09 DIAGNOSIS — J029 Acute pharyngitis, unspecified: Secondary | ICD-10-CM | POA: Insufficient documentation

## 2020-04-09 LAB — GROUP A STREP BY PCR: Group A Strep by PCR: NOT DETECTED

## 2020-04-09 MED ORDER — NAPROXEN 500 MG PO TABS: 500.0000 mg | ORAL_TABLET | Freq: Two times a day (BID) | ORAL | 0 refills | Status: AC

## 2020-04-09 MED ORDER — AMOXICILLIN 500 MG PO CAPS: 500.0000 mg | ORAL_CAPSULE | Freq: Three times a day (TID) | ORAL | 0 refills | Status: AC

## 2020-04-09 NOTE — ED Triage Notes (Signed)
Pt coming from home. Complaint of abcess in back of mouth. Pt states that he has had this before. Pt presents with paint and states it hurt to swallow. VSS. NAD.

## 2020-04-09 NOTE — Discharge Instructions (Addendum)
Take the medications as prescribed.  Follow-up with a primary care doctor if not resolved within the week.

## 2020-04-09 NOTE — ED Notes (Signed)
Patient verbalizes understanding of discharge instructions. Opportunity for questioning and answers were provided. Pt discharged from ED ambulatory.  

## 2020-04-09 NOTE — ED Provider Notes (Signed)
[facility] EMERGENCY DEPARTMENT Provider Note   CSN: [id number] Arrival date & time: 04/09/20  1110     History Chief Complaint  Patient presents with  . Dental Pain  . Abscess    Alexander Washington is a 24 y.o. male.  HPI   Patient presents to the ED for evaluation of a sore throat and swelling in his neck.  Patient is concerned he might have an abscess.  Patient states he started noticing pain to the back of his throat and he felt a lump in the anterior part of his neck.  He has had abscesses before that had to be drained and he was concerned he was developing another one.  Patient denies any dental pain or tooth ache.  He denies any fevers.  No difficulty breathing or speaking  Past Medical History:  Diagnosis Date  . Asthma     There are no problems to display for this patient.   No past surgical history on file.     No family history on file.  Social History   Tobacco Use  . Smoking status: Never Smoker  . Smokeless tobacco: Never Used  Substance Use Topics  . Alcohol use: Never  . Drug use: Never    Home Medications Prior to Admission medications   Medication Sig Start Date End Date Taking? Authorizing Provider  amoxicillin (AMOXIL) 500 MG capsule Take 1 capsule (500 mg total) by mouth 3 (three) times daily. 04/09/20   Linwood Dibbles, MD  HYDROcodone-acetaminophen (NORCO/VICODIN) 5-325 MG tablet Take 1-2 tablets by mouth every 6 (six) hours as needed. 09/28/19   Maxwell Caul, PA-C  methylPREDNISolone (MEDROL DOSEPAK) 4 MG TBPK tablet Use per package directions. Patient not taking: Reported on 09/27/2019 07/21/19   Benjiman Core, MD  naproxen (NAPROSYN) 500 MG tablet Take 1 tablet (500 mg total) by mouth 2 (two) times daily with a meal. As needed for pain 04/09/20   Linwood Dibbles, MD    Allergies    Fish allergy  Review of Systems   Review of Systems  All other systems reviewed and are negative.   Physical Exam Updated Vital  Signs BP 105/69   Pulse 90   Temp 98.4 F (36.9 C) (Oral)   Resp 14   Ht 1.803 m (5\' 11" )   Wt 77.1 kg   SpO2 100%   BMI 23.71 kg/m   Physical Exam Vitals and nursing note reviewed.  Constitutional:      General: He is not in acute distress.    Appearance: He is well-developed.  HENT:     Head: Normocephalic and atraumatic.     Right Ear: External ear normal.     Left Ear: External ear normal.     Mouth/Throat:     Tongue: No lesions.     Pharynx: Posterior oropharyngeal erythema present. No oropharyngeal exudate.     Tonsils: No tonsillar exudate or tonsillar abscesses.     Comments: No dental abscess, no uvula swelling, no peritonsillar edema or swelling, posterior pharyngeal erythema noted Eyes:     General: No scleral icterus.       Right eye: No discharge.        Left eye: No discharge.     Conjunctiva/sclera: Conjunctivae normal.  Neck:     Trachea: No tracheal deviation.     Comments: Small tender lymph node right anterior cervical chain, no fluctuance Cardiovascular:     Rate and Rhythm: Normal rate.  Pulmonary:  Effort: Pulmonary effort is normal. No respiratory distress.     Breath sounds: No stridor.  Abdominal:     General: There is no distension.  Musculoskeletal:        General: No swelling or deformity.     Cervical back: Neck supple.  Skin:    General: Skin is warm and dry.     Findings: No rash.  Neurological:     Mental Status: He is alert.     Cranial Nerves: Cranial nerve deficit: no gross deficits.     ED Results / Procedures / Treatments   Labs (all labs ordered are listed, but only abnormal results are displayed) Labs Reviewed  GROUP A STREP BY PCR    EKG None  Radiology No results found.  Procedures Procedures (including critical care time)  Medications Ordered in ED Medications - No data to display  ED Course  I have reviewed the triage vital signs and the nursing notes.  Pertinent labs & imaging results that were  available during my care of the patient were reviewed by me and considered in my medical decision making (see chart for details).    MDM Rules/Calculators/A&P                          Patient presents to the ED for evaluation of sore throat and lymphadenopathy.  Patient did have erythema in the posterior pharynx.  Strep test is negative.  Patient does have focal lymph node swelling.  Will treat with a course of antibiotics for lymphadenitis.  Could also be viral in nature.  Discussed outpatient follow-up with PCP if not improving.  No signs of abscess on exam. Final Clinical Impression(s) / ED Diagnoses Final diagnoses:  Pharyngitis, unspecified etiology  Lymphadenopathy    Rx / DC Orders ED Discharge Orders         Ordered    amoxicillin (AMOXIL) 500 MG capsule  3 times daily        04/09/20 1400    naproxen (NAPROSYN) 500 MG tablet  2 times daily with meals        04/09/20 1400           Linwood Dibbles, MD 04/09/20 1402

## 2021-01-16 IMAGING — CR DG CHEST 2V
2 series · 2 of 2 positions shown · non-contrast
Comparison: None.

CLINICAL DATA: Cough

EXAM:
CHEST - 2 VIEW

[chest pa]
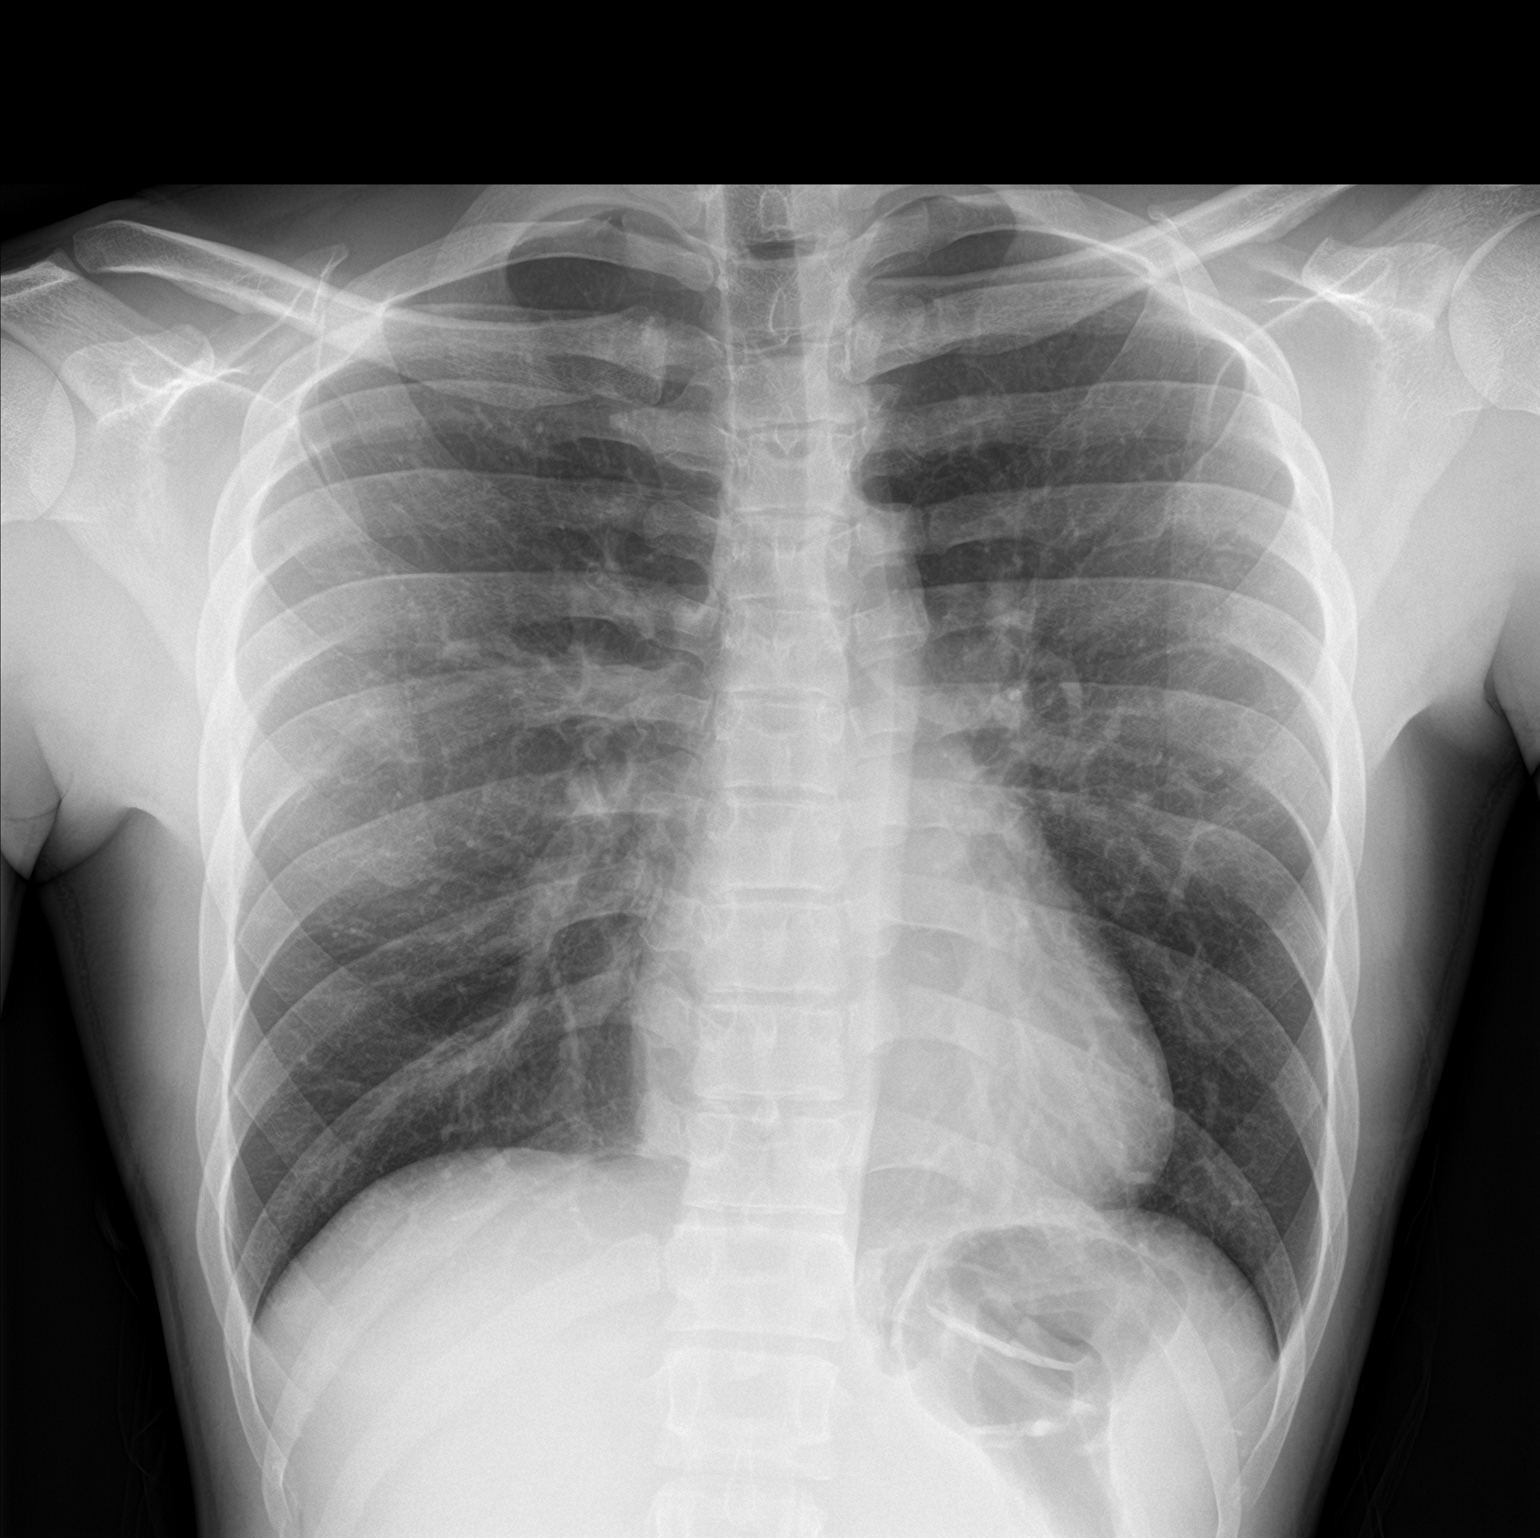

[chest lat]
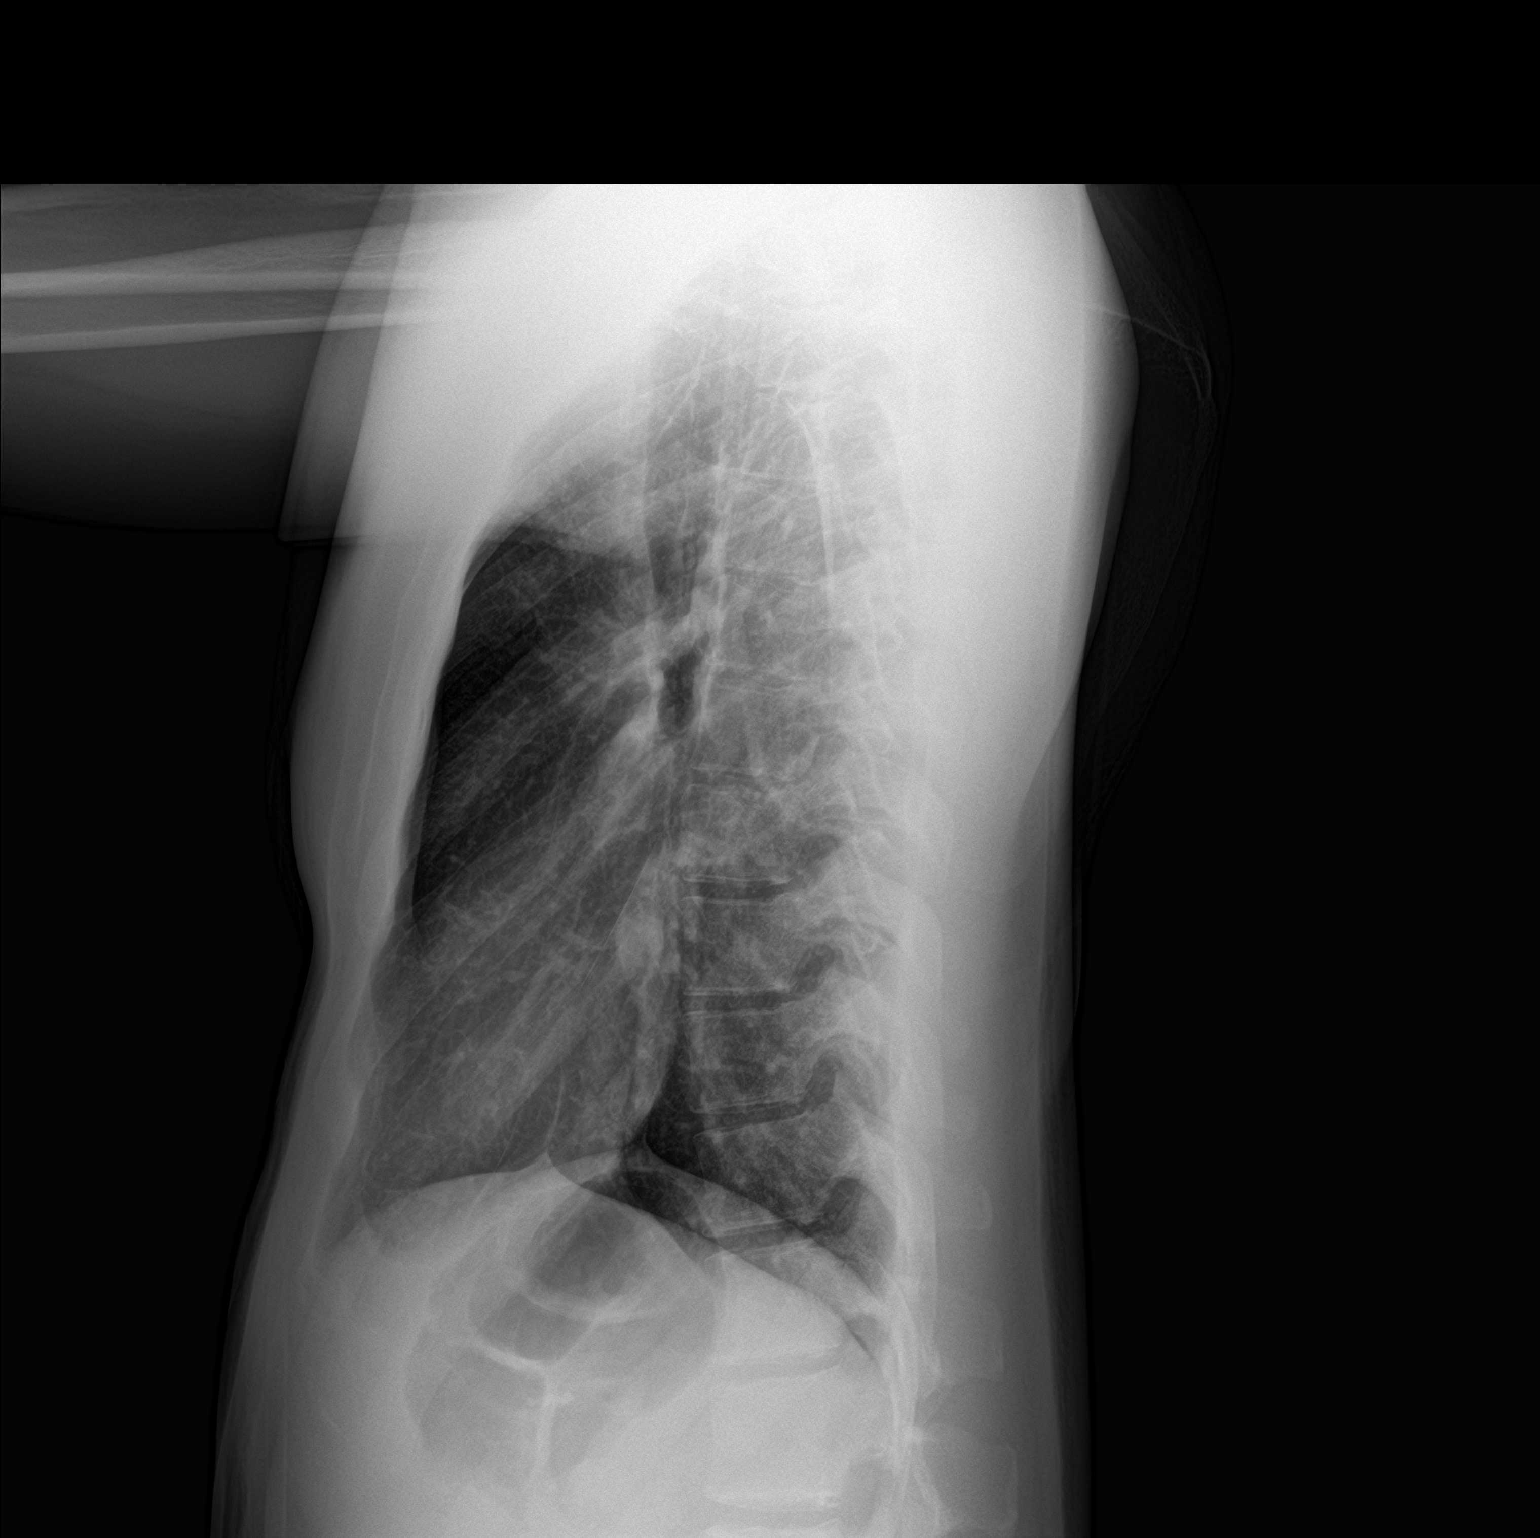

[2 of 2 positions shown; findings below may reference images not displayed]

FINDINGS: The heart size and mediastinal contours are within normal limits.
Both lungs are clear. The visualized skeletal structures are
unremarkable.
IMPRESSION: No active cardiopulmonary disease.

## 2022-01-30 IMAGING — CT CT NECK W/ CM
4 of 5 series · 14 of 33 positions shown, 16 images · IV contrast (APPLIED)
Comparison: No pertinent prior studies available for comparison.

CLINICAL DATA: Possible left peritonsillar abscess. Additional
history provided by scanning technologist: Left-sided neck and jaw
pain since [REDACTED], patient reports persistent sore throat
swelling/dysphagia onset last week.

EXAM:
CT NECK WITH CONTRAST
TECHNIQUE: Multidetector CT imaging of the neck was performed using the
standard protocol following the bolus administration of intravenous
contrast.
CONTRAST:  75mL OMNIPAQUE IOHEXOL 300 MG/ML  SOLN

[Series 3: neck 2.0 i31s 3 · axial · 0.49mm/px · z∈[-231,-75]mm · 4 of 131 slices shown, 5 images]
[im 27/131  soft-tissue]
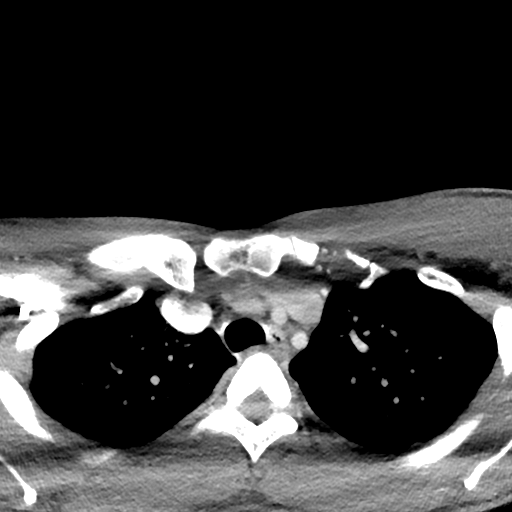
[im 27/131  bone]
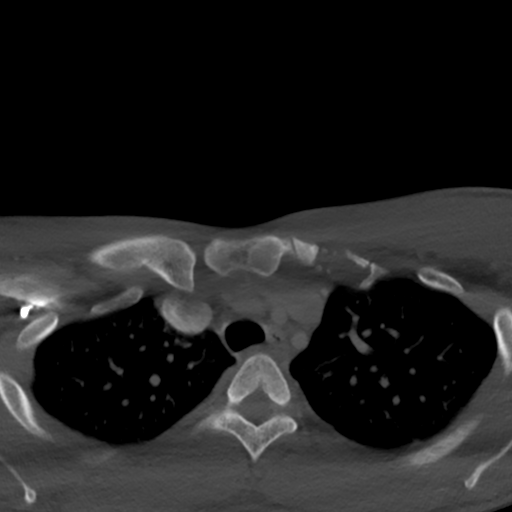
[im 53/131  bone]
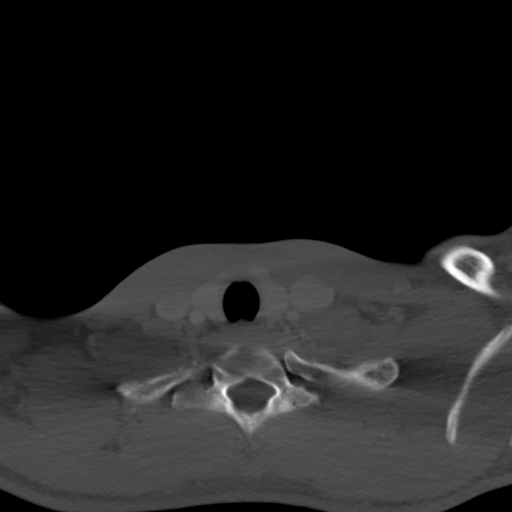
[im 79/131  bone]
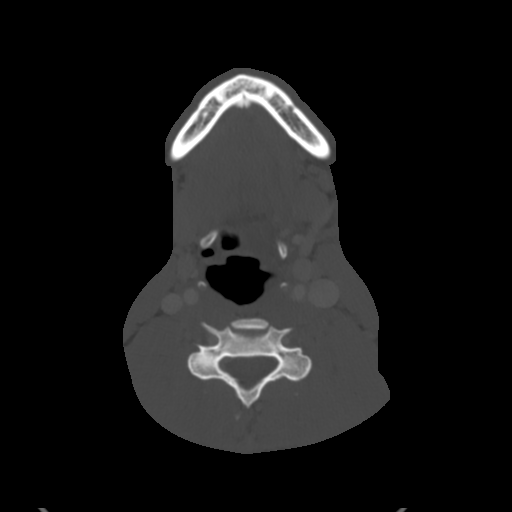
[im 105/131  bone]
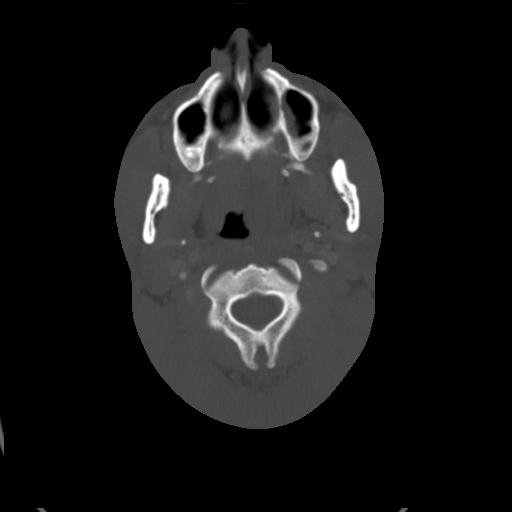

[Series 7: coronal st · coronal · 0.32mm/px · 3 of 118 slices shown]
[im 24/118  bone]
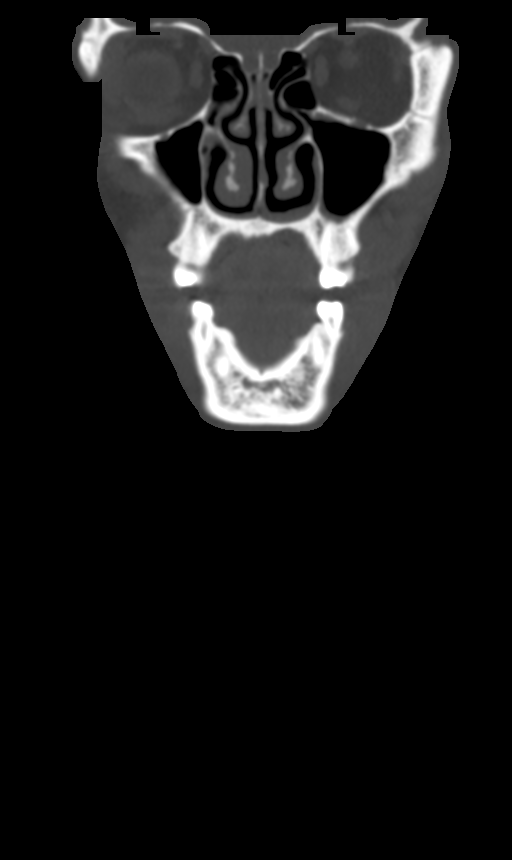
[im 47/118  bone]
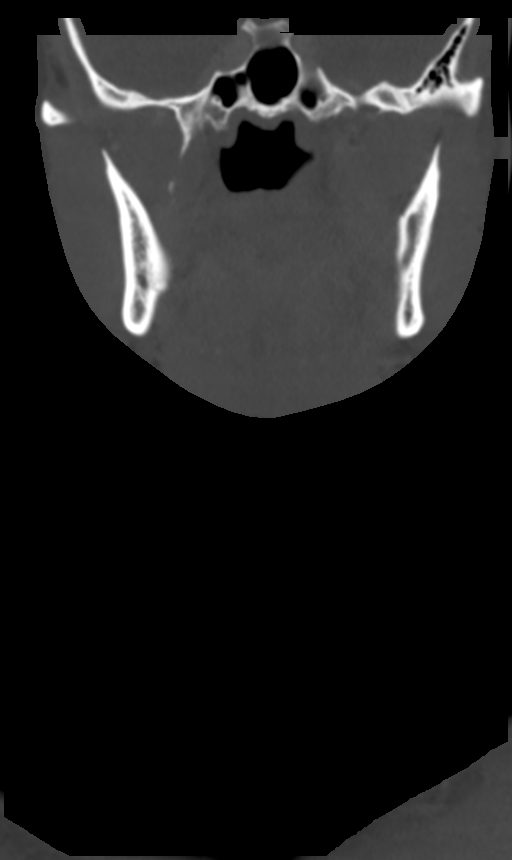
[im 71/118  bone]
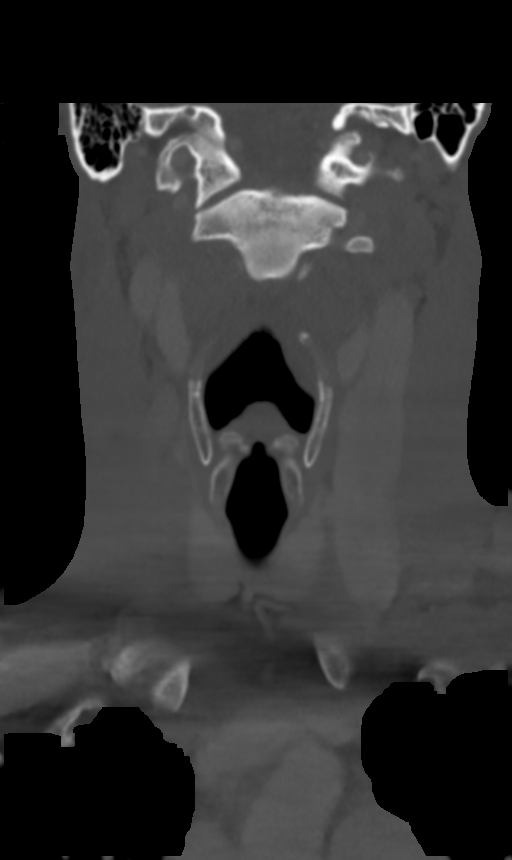

[Series 8: sagittal st · sagittal · 0.51mm/px · 5 of 101 slices shown, 6 images]
[im 34/101  bone]
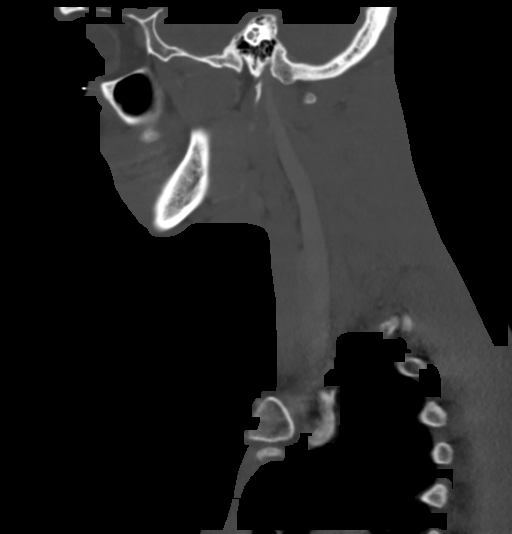
[im 42/101  bone]
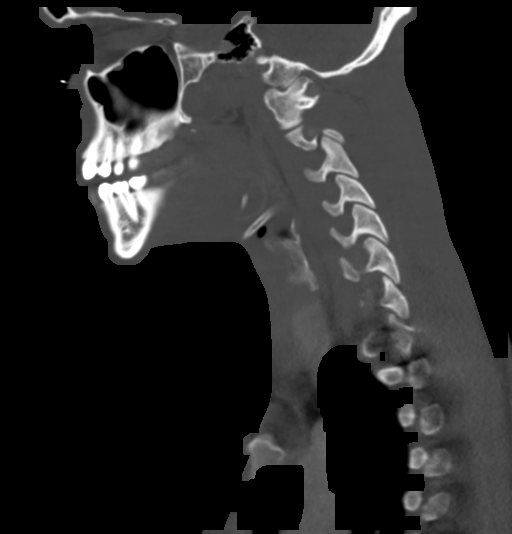
[im 51/101  soft-tissue]
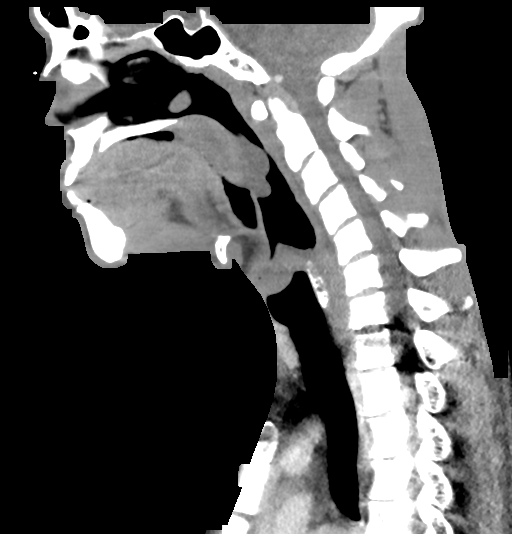
[im 51/101  bone]
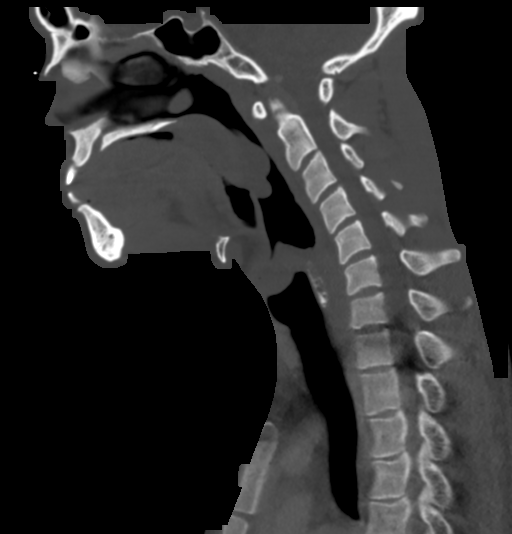
[im 59/101  bone]
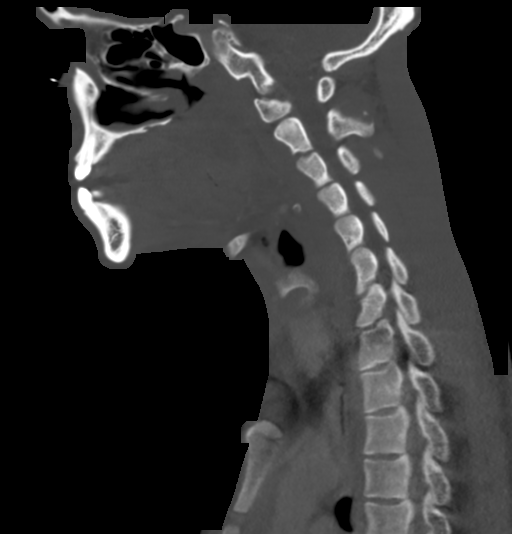
[im 67/101  bone]
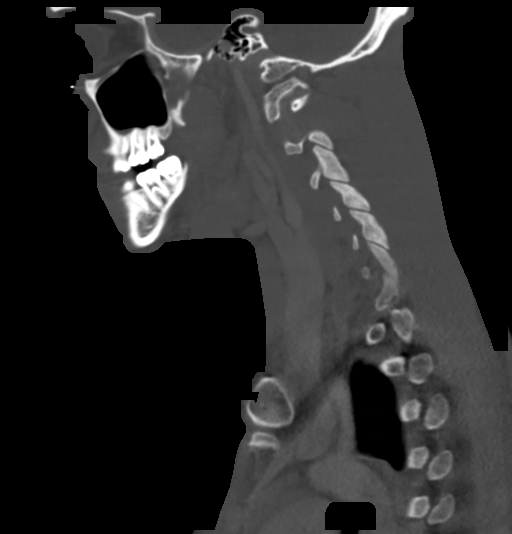

[Series 9: orthogonal st · axial · 0.39mm/px · z∈[-229,-175]mm · 2 of 134 slices shown]
[im 27/134  bone]
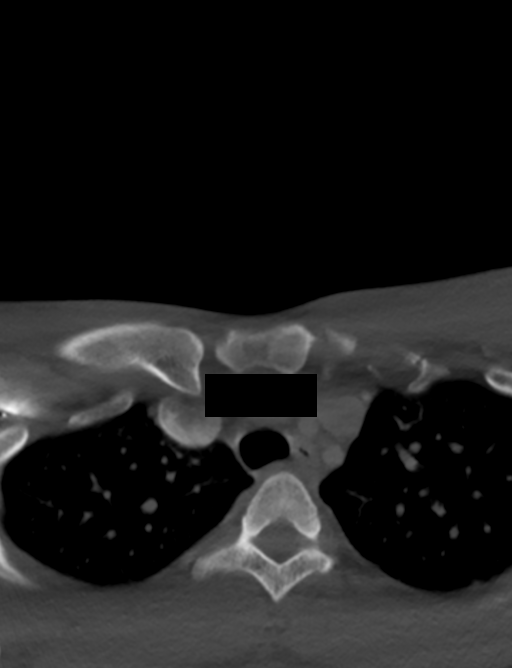
[im 54/134  bone]
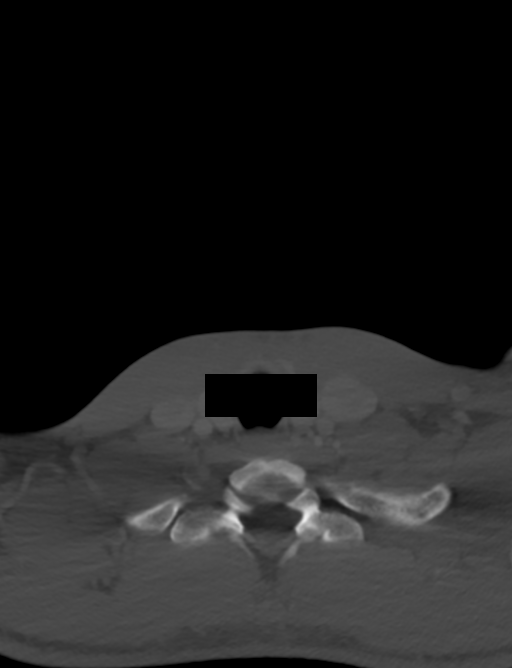

[14 of 33 positions shown; findings below may reference images not displayed]

FINDINGS: Pharynx and larynx: There is swelling and prominence of the palatine
tonsils (greater on the left). Associated mucosal/submucosal edema.
Findings are consistent with tonsillitis and pharyngitis. Associated
2.1 x 1.5 x 2.9 cm focus of circumscribed hypodensity in the region
of the left palatine tonsil consistent with peritonsillar abscess
(series 3, image 33) (series 7, image 53). Moderate effacement of
the oropharyngeal airway. The epiglottis is unremarkable. No
appreciable laryngeal mass or swelling.

Salivary glands: No inflammation, mass, or stone.

Thyroid: No abnormality.

Lymph nodes: Left level II lymphadenopathy, likely reactive.

Vascular: The major vascular structures of the neck appear patent.

Limited intracranial: No abnormality identified.

Visualized orbits: Visualized orbits demonstrate no acute
abnormality.

Mastoids and visualized paranasal sinuses: No significant paranasal
sinus disease or mastoid effusion at the imaged levels.

Skeleton: No acute bony abnormality. Reversal of the expected
cervical lordosis.

Upper chest: No consolidation within the imaged lung apices.
IMPRESSION: Findings consistent with tonsillitis and pharyngitis. Associated
cm left peritonsillar abscess. Moderate effacement of the
oropharyngeal airway.

Left upper cervical chain lymphadenopathy, likely reactive.
